# Patient Record
Sex: Male | Born: 1940 | Race: Black or African American | Hispanic: No | Marital: Married | Smoking: Never smoker
Health system: Southern US, Community
[De-identification: ages and names within clinical notes are randomized; demographics above are authoritative.]

## PROBLEM LIST (undated history)

## (undated) DIAGNOSIS — Z7901 Long term (current) use of anticoagulants: Secondary | ICD-10-CM

## (undated) DIAGNOSIS — Z862 Personal history of diseases of the blood and blood-forming organs and certain disorders involving the immune mechanism: Secondary | ICD-10-CM

## (undated) DIAGNOSIS — K869 Disease of pancreas, unspecified: Secondary | ICD-10-CM

## (undated) DIAGNOSIS — Z8709 Personal history of other diseases of the respiratory system: Secondary | ICD-10-CM

## (undated) DIAGNOSIS — G4730 Sleep apnea, unspecified: Secondary | ICD-10-CM

## (undated) DIAGNOSIS — Z87448 Personal history of other diseases of urinary system: Secondary | ICD-10-CM

## (undated) DIAGNOSIS — C61 Malignant neoplasm of prostate: Secondary | ICD-10-CM

## (undated) DIAGNOSIS — Z8639 Personal history of other endocrine, nutritional and metabolic disease: Secondary | ICD-10-CM

## (undated) DIAGNOSIS — R002 Palpitations: Secondary | ICD-10-CM

## (undated) DIAGNOSIS — I119 Hypertensive heart disease without heart failure: Secondary | ICD-10-CM

## (undated) DIAGNOSIS — Z86711 Personal history of pulmonary embolism: Secondary | ICD-10-CM

## (undated) DIAGNOSIS — Z86718 Personal history of other venous thrombosis and embolism: Secondary | ICD-10-CM

## (undated) DIAGNOSIS — I10 Essential (primary) hypertension: Secondary | ICD-10-CM

## (undated) DIAGNOSIS — Z8739 Personal history of other diseases of the musculoskeletal system and connective tissue: Secondary | ICD-10-CM

## (undated) DIAGNOSIS — M109 Gout, unspecified: Secondary | ICD-10-CM

## (undated) HISTORY — DX: Disease of pancreas, unspecified: K86.9

## (undated) HISTORY — DX: Sleep apnea, unspecified: G47.30

## (undated) HISTORY — PX: CHOLECYSTECTOMY: SHX55

## (undated) HISTORY — DX: Malignant neoplasm of prostate: C61

## (undated) HISTORY — DX: Personal history of other endocrine, nutritional and metabolic disease: Z86.39

## (undated) HISTORY — DX: Personal history of diseases of the blood and blood-forming organs and certain disorders involving the immune mechanism: Z86.2

## (undated) HISTORY — DX: Personal history of other diseases of urinary system: Z87.448

## (undated) HISTORY — DX: Long term (current) use of anticoagulants: Z79.01

## (undated) HISTORY — DX: Personal history of other diseases of the musculoskeletal system and connective tissue: Z87.39

## (undated) HISTORY — DX: Personal history of pulmonary embolism: Z86.711

## (undated) HISTORY — DX: Hypertensive heart disease without heart failure: I11.9

## (undated) HISTORY — PX: OTHER SURGICAL HISTORY: SHX169

## (undated) HISTORY — PX: APPENDECTOMY: SHX54

## (undated) HISTORY — PX: LEG SURGERY: SHX1003

## (undated) HISTORY — DX: Personal history of other venous thrombosis and embolism: Z86.718

## (undated) HISTORY — DX: Personal history of other diseases of the respiratory system: Z87.09

---

## 2008-03-15 ENCOUNTER — Inpatient Hospital Stay (HOSPITAL_COMMUNITY): Admission: RE | Admit: 2008-03-15 | Discharge: 2008-03-19 | Payer: Self-pay | Admitting: Orthopedic Surgery

## 2010-04-20 LAB — CROSSMATCH: ABO/RH(D): O POS

## 2010-04-20 LAB — BASIC METABOLIC PANEL
BUN: 8 mg/dL (ref 6–23)
Calcium: 8.4 mg/dL (ref 8.4–10.5)
Calcium: 8.5 mg/dL (ref 8.4–10.5)
Chloride: 105 mEq/L (ref 96–112)
Chloride: 96 mEq/L (ref 96–112)
Chloride: 99 mEq/L (ref 96–112)
Creatinine, Ser: 1.06 mg/dL (ref 0.4–1.5)
Creatinine, Ser: 1.47 mg/dL (ref 0.4–1.5)
GFR calc Af Amer: 52 mL/min — ABNORMAL LOW (ref >60)
GFR calc Af Amer: 58 mL/min — ABNORMAL LOW (ref >60)
GFR calc Af Amer: >60 mL/min (ref >60)
Potassium: 3.3 mEq/L — ABNORMAL LOW (ref 3.5–5.1)
Sodium: 132 mEq/L — ABNORMAL LOW (ref 135–145)

## 2010-04-20 LAB — TYPE AND SCREEN: ABO/RH(D): O POS

## 2010-04-20 LAB — CBC
HCT: 27.3 % — ABNORMAL LOW (ref 39.0–52.0)
HCT: 39.9 % (ref 39.0–52.0)
Hemoglobin: 9.2 g/dL — ABNORMAL LOW (ref 13.0–17.0)
MCHC: 34.7 g/dL (ref 30.0–36.0)
MCV: 89.3 fL (ref 78.0–100.0)
MCV: 90.3 fL (ref 78.0–100.0)
MCV: 92 fL (ref 78.0–100.0)
MCV: 92.5 fL (ref 78.0–100.0)
Platelets: 131 10*3/uL — ABNORMAL LOW (ref 150–400)
Platelets: 200 10*3/uL (ref 150–400)
RBC: 2.85 MIL/uL — ABNORMAL LOW (ref 4.22–5.81)
RBC: 3.05 MIL/uL — ABNORMAL LOW (ref 4.22–5.81)
RDW: 16.1 % — ABNORMAL HIGH (ref 11.5–15.5)
WBC: 10.9 10*3/uL — ABNORMAL HIGH (ref 4.0–10.5)
WBC: 11.6 10*3/uL — ABNORMAL HIGH (ref 4.0–10.5)
WBC: 8.5 10*3/uL (ref 4.0–10.5)

## 2010-04-20 LAB — COMPREHENSIVE METABOLIC PANEL
AST: 49 U/L — ABNORMAL HIGH (ref 0–37)
Albumin: 4.2 g/dL (ref 3.5–5.2)
BUN: 16 mg/dL (ref 6–23)
Calcium: 9.8 mg/dL (ref 8.4–10.5)
Creatinine, Ser: 1.24 mg/dL (ref 0.4–1.5)
GFR calc Af Amer: >60 mL/min (ref >60)
Total Bilirubin: 0.7 mg/dL (ref 0.3–1.2)

## 2010-04-20 LAB — PROTIME-INR
INR: 1.1 (ref 0.00–1.49)
Prothrombin Time: 14.1 seconds (ref 11.6–15.2)

## 2010-04-20 LAB — URINE CULTURE
Colony Count: NO GROWTH
Culture: NO GROWTH

## 2010-04-20 LAB — DIFFERENTIAL
Band Neutrophils: 0 % (ref 0–10)
Blasts: 0 %
Eosinophils Absolute: 0.1 10*3/uL (ref 0.0–0.7)
Eosinophils Relative: 2 % (ref 0–5)
Lymphocytes Relative: 45 % (ref 12–46)
Lymphs Abs: 2.2 10*3/uL (ref 0.7–4.0)
Metamyelocytes Relative: 0 %
Monocytes Absolute: 0.4 10*3/uL (ref 0.1–1.0)
Monocytes Relative: 9 % (ref 3–12)
nRBC: 0 /100 WBC

## 2010-04-20 LAB — URINALYSIS, ROUTINE W REFLEX MICROSCOPIC
Bilirubin Urine: NEGATIVE
Glucose, UA: NEGATIVE mg/dL
Hgb urine dipstick: NEGATIVE
Specific Gravity, Urine: 1.017 (ref 1.005–1.030)

## 2010-04-20 LAB — PREPARE RBC (CROSSMATCH)

## 2010-05-23 NOTE — Op Note (Signed)
Jose Brewer, DUMONT NO.:  [id number]   MEDICAL RECORD NO.:  [id number]          PATIENT TYPE:  INP   LOCATION:  5006                         FACILITY:  [facility]   PHYSICIAN:  Mila Homer. Sherlean Foot, M.D. DATE OF BIRTH:  06/29/1940   DATE OF PROCEDURE:  03/15/2008  DATE OF DISCHARGE:                               OPERATIVE REPORT   SURGEON:  Mila Homer. Sherlean Foot, MD   ASSISTANTS:  1. Altamese Cabal, PA-C  2. Skip Mayer, PA-C   ANESTHESIA:  General.   PREOPERATIVE DIAGNOSIS:  Right knee osteoarthritis.   POSTOPERATIVE DIAGNOSIS:  Right knee osteoarthritis.   PROCEDURE:  Right total knee arthroplasty.   INDICATIONS FOR PROCEDURE:  The patient is a 70 year old black male with  failure of conservative measures for osteoarthritis of the right knee.  Informed consent was obtained.   DESCRIPTION OF PROCEDURE:  The patient was laid supine, administered  general anesthesia.  Foley catheter was placed.  Right leg was prepped  and draped in usual sterile fashion.  The extremity was exsanguinated  with an Esmarch, and tourniquet was inflated to 350 mmHg.  Midline  incision was made with a #10 blade.  New blade was used to make a medial  parapatellar arthrotomy and performed synovectomy.  I then elevated deep  MCL off the medial crest of the tibia around to the semimembranosus  tendon and elevated the pes tendons.  This was significantly varus knee.  I then everted the patella, measured 21-mm thick.  I reamed down to 13  mm, drilled 3 lug holes through the 35-mm template and recreated the  thickness of the native patella.  I then removed the trial component and  went into flexion.  I used the extramedullary alignment system on the  tibia to make a 9-degree cut to the anatomic axis of tibia and 2-mm bone  off the medial side, which was low side.  I then used the intramedullary  system on the femur to make a 6-degree valgus cut.  I then drew out the  epicondylar axis, posterior  condylar angle measured 5 degrees.  Sized to  a size E with a sizing device, pinned to 3- to 5-degree external  rotation hole.  I placed a formal cutting block into place for size E.  Made anterior, posterior, and chamfer cuts with a sagittal saw.  I then  placed a 10-mm spacer block in the knee and obtained flexion/extension  gap balance.  I then finished the femur with a size E finishing block  and the tibia with a size 5 tibial tray drilling keel.  I then trialed  with a E femur, 5 tibia, 10 insert, and 35 patella, had good  flexion/extension gap balance, good patellar tracking.  I then copiously  irrigated with the components removed.  I then cemented in all  components, removed excess cement, allowed the cement to harden in  extension.  I placed a Hemovac coming out superolaterally and deep the  arthrotomy, pain catheter coming out superomedially and superficial  arthrotomy.  I let the tourniquet down.  The cement was hard.  Obtained  hemostasis, copiously irrigated again.  I then closed the arthrotomy  with figure-of-eight #1 Vicryl suture, deep soft tissues with buried 0  Vicryl sutures, and then subcuticular 2-0 Vicryl stitch.  I closed the  skin with skin staples.  Dressed with Xeroform dressing, sponges,  sterile Webril, and TED stocking.   COMPLICATIONS:  None.   DRAINS:  One Hemovac and one pain catheter.   ESTIMATED BLOOD LOSS:  300 mL.           ______________________________  Mila Homer. Sherlean Foot, M.D.     SDL/MEDQ  D:  03/15/2008  T:  03/15/2008  Job:  [id number]

## 2010-05-26 NOTE — Discharge Summary (Signed)
NAMEJONPAUL, LUMM NO.:  [id number]   MEDICAL RECORD NO.:  [id number]          PATIENT TYPE:  INP   LOCATION:  5006                         FACILITY:  [facility]   PHYSICIAN:  Mila Homer. Sherlean Foot, M.D. DATE OF BIRTH:  [birth year]   DATE OF ADMISSION:  03/15/2008  DATE OF DISCHARGE:  03/19/2008                               DISCHARGE SUMMARY   ADMISSION DIAGNOSES:  1. Right knee osteoarthritis.  2. Gout.  3. Hypertension.   DISCHARGE DIAGNOSES:  1. Right knee osteoarthritis.  2. Gout.  3. Hypertension.  4. Status post right total knee arthroplasty.  5. Acute blood loss anemia, status post surgery.   PROCEDURE:  Right total knee arthroplasty.   HISTORY:  The patient is a 70 year old male with a chief complaint of  right knee pain that is constant and severe.  The patient walks with a  cane, pain is interfering with activities of daily living.  Conservative  treatment had failed.  Risk and benefits of surgery were discussed with  the patient.  The patient would like to proceed with the right TKA.   ALLERGIES:  PENICILLIN.   ADMISSION MEDICATIONS:  1. Allopurinol 300 daily.  2. Metoprolol 500 daily.  3. Ibuprofen as needed.  4. Aspirin 81 mg daily.   HOSPITAL COURSE:  This is a 70 year old male, admitted March 15, 2008,  after appropriate laboratory studies were obtained preoperatively as  well as vancomycin on-call to the operating room.  He was taken to the  OR where he underwent a right TKA.  The patient tolerated the procedure  well and was taken to the PACU in good condition.  The patient was  placed on p.o. pain medication, and Foley was placed intraoperatively.   On postop day #1, vital signs were stable.  The patient denied chest  pain, shortness breath, or calf pain.  The patient started on Lovenox 30  mg subcu q.12 h. a day at 8 a.m.  Consults with PT, OT, and Care  Management were made.  The patient is weightbearing as tolerated.  The  patient was  placed in CPM 0-90 degrees for 6-8 hours per day.  Incentive  spirometry teaching was done.  On postop day #2, the patient was  struggling with physical therapy, had required moderate assist to move  from bed to chair.  Dressing was changed.  Marcaine pump was  discontinued.  Hemovac was discontinued as well.  Foley was  discontinued.  The patient was continued on p.o. pain meds.  On postop  day #3, the patient continued to require moderate assist when moving,  had some issues with physical therapy and been able to go home.  The  patient was kept for one more event.  On postop day #4, the patient did  much better with physical therapy and was able to be sent home.  He was  discharged after Lovenox teaching.   LABORATORY STUDIES:  On admission to the hospital, the patient's white  blood cell count was 4.9, H and H 13.9 and 39.9, and platelets were 200.  Sodium was 138, potassium was 36, chloride  was 104, CO2 was 25, glucose  was 90, BUN was 16, and creatinine was 124.  Upon discharge, the  patient's white blood cell count was 11.6, H and H was 9.8 and 27.3, and  platelets were 116.  Sodium was 130, potassium was 3.3, chloride was 96,  CO2 was 27, glucose was 111, BUN was 17, and creatinine was 1.60.   DISCHARGE INSTRUCTIONS:  There were no restrictions to diet.  The  patient, however, was placed on potassium pills and should limit his  fluids because the sodium was little low.  Follow the blue instruction  sheet for wound care.  Increase activity slowly.  May use a cane or  walker, weightbearing as tolerated.  No lifting or driving for 6 weeks.  Home health was established.  The patient is to be on CPM 0-90 degrees  for 6-8 hours a day for 2 weeks.  The patient is to continue wearing  thigh-high TED hose for 3 weeks.   DISCHARGE MEDICATIONS:  At discharge, the patient was given  prescriptions for:  1. Lovenox 40 mg inject 1 subcu daily, last dose March 29, 2008.  2. Robaxin 500 mg 1-2  tabs every 6 hours as needed for spasm, #60.  3. Vicodin 5/500 one to two tabs every 4-6 hours as needed for pain,      #60.  4. KCl once twice a day for a week.   The patient will follow up with Dr. Sherlean Foot on March 29, 2008, call for  appointment [phone].  The patient is discharged in improved condition.     ______________________________  Altamese Cabal, PA-C    ______________________________  Mila Homer. Sherlean Foot, M.D.    MJ/MEDQ  D:  04/30/2008  T:  04/30/2008  Job:  [id number]

## 2013-03-14 ENCOUNTER — Emergency Department (HOSPITAL_COMMUNITY)
Admission: EM | Admit: 2013-03-14 | Discharge: 2013-03-14 | Disposition: A | Discharge status: Home / Self Care | Payer: Medicare Other | Attending: Emergency Medicine | Admitting: Emergency Medicine

## 2013-03-14 ENCOUNTER — Emergency Department (HOSPITAL_COMMUNITY): Payer: Medicare Other

## 2013-03-14 ENCOUNTER — Encounter (HOSPITAL_COMMUNITY): Payer: Self-pay | Admitting: Emergency Medicine

## 2013-03-14 DIAGNOSIS — Z7901 Long term (current) use of anticoagulants: Secondary | ICD-10-CM | POA: Insufficient documentation

## 2013-03-14 DIAGNOSIS — K862 Cyst of pancreas: Secondary | ICD-10-CM

## 2013-03-14 DIAGNOSIS — R0789 Other chest pain: Secondary | ICD-10-CM

## 2013-03-14 DIAGNOSIS — IMO0002 Reserved for concepts with insufficient information to code with codable children: Secondary | ICD-10-CM | POA: Insufficient documentation

## 2013-03-14 DIAGNOSIS — W19XXXA Unspecified fall, initial encounter: Secondary | ICD-10-CM

## 2013-03-14 DIAGNOSIS — S99929A Unspecified injury of unspecified foot, initial encounter: Secondary | ICD-10-CM

## 2013-03-14 DIAGNOSIS — M25552 Pain in left hip: Secondary | ICD-10-CM

## 2013-03-14 DIAGNOSIS — S0993XA Unspecified injury of face, initial encounter: Secondary | ICD-10-CM | POA: Insufficient documentation

## 2013-03-14 DIAGNOSIS — Y9239 Other specified sports and athletic area as the place of occurrence of the external cause: Secondary | ICD-10-CM | POA: Insufficient documentation

## 2013-03-14 DIAGNOSIS — S298XXA Other specified injuries of thorax, initial encounter: Secondary | ICD-10-CM | POA: Insufficient documentation

## 2013-03-14 DIAGNOSIS — I447 Left bundle-branch block, unspecified: Secondary | ICD-10-CM | POA: Insufficient documentation

## 2013-03-14 DIAGNOSIS — M109 Gout, unspecified: Secondary | ICD-10-CM | POA: Insufficient documentation

## 2013-03-14 DIAGNOSIS — I10 Essential (primary) hypertension: Secondary | ICD-10-CM | POA: Insufficient documentation

## 2013-03-14 DIAGNOSIS — S199XXA Unspecified injury of neck, initial encounter: Secondary | ICD-10-CM

## 2013-03-14 DIAGNOSIS — W010XXA Fall on same level from slipping, tripping and stumbling without subsequent striking against object, initial encounter: Secondary | ICD-10-CM | POA: Insufficient documentation

## 2013-03-14 DIAGNOSIS — R109 Unspecified abdominal pain: Secondary | ICD-10-CM

## 2013-03-14 DIAGNOSIS — Z880 Allergy status to penicillin: Secondary | ICD-10-CM | POA: Insufficient documentation

## 2013-03-14 DIAGNOSIS — Z7982 Long term (current) use of aspirin: Secondary | ICD-10-CM | POA: Insufficient documentation

## 2013-03-14 DIAGNOSIS — M79671 Pain in right foot: Secondary | ICD-10-CM

## 2013-03-14 DIAGNOSIS — Y9301 Activity, walking, marching and hiking: Secondary | ICD-10-CM | POA: Insufficient documentation

## 2013-03-14 DIAGNOSIS — S8990XA Unspecified injury of unspecified lower leg, initial encounter: Secondary | ICD-10-CM | POA: Insufficient documentation

## 2013-03-14 DIAGNOSIS — Y92838 Other recreation area as the place of occurrence of the external cause: Secondary | ICD-10-CM

## 2013-03-14 DIAGNOSIS — R000 Tachycardia, unspecified: Secondary | ICD-10-CM | POA: Insufficient documentation

## 2013-03-14 DIAGNOSIS — S99919A Unspecified injury of unspecified ankle, initial encounter: Secondary | ICD-10-CM

## 2013-03-14 DIAGNOSIS — Z79899 Other long term (current) drug therapy: Secondary | ICD-10-CM | POA: Insufficient documentation

## 2013-03-14 DIAGNOSIS — S0990XA Unspecified injury of head, initial encounter: Secondary | ICD-10-CM | POA: Insufficient documentation

## 2013-03-14 HISTORY — DX: Gout, unspecified: M10.9

## 2013-03-14 HISTORY — DX: Palpitations: R00.2

## 2013-03-14 HISTORY — DX: Essential (primary) hypertension: I10

## 2013-03-14 LAB — CBC WITH DIFFERENTIAL/PLATELET
BASOS ABS: 0 10*3/uL (ref 0.0–0.1)
BASOS PCT: 0 % (ref 0–1)
Eosinophils Absolute: 0 10*3/uL (ref 0.0–0.7)
Eosinophils Relative: 0 % (ref 0–5)
HEMATOCRIT: 37.4 % — AB (ref 39.0–52.0)
HEMOGLOBIN: 13.4 g/dL (ref 13.0–17.0)
LYMPHS PCT: 23 % (ref 12–46)
Lymphs Abs: 1 10*3/uL (ref 0.7–4.0)
MCH: 32.1 pg (ref 26.0–34.0)
MCHC: 35.8 g/dL (ref 30.0–36.0)
MCV: 89.5 fL (ref 78.0–100.0)
MONO ABS: 0.5 10*3/uL (ref 0.1–1.0)
MONOS PCT: 11 % (ref 3–12)
NEUTROS ABS: 2.8 10*3/uL (ref 1.7–7.7)
NEUTROS PCT: 66 % (ref 43–77)
Platelets: 235 10*3/uL (ref 150–400)
RBC: 4.18 MIL/uL — ABNORMAL LOW (ref 4.22–5.81)
RDW: 14.6 % (ref 11.5–15.5)
WBC: 4.3 10*3/uL (ref 4.0–10.5)

## 2013-03-14 LAB — BASIC METABOLIC PANEL
BUN: 18 mg/dL (ref 6–23)
CHLORIDE: 96 meq/L (ref 96–112)
CO2: 22 mEq/L (ref 19–32)
Calcium: 9.6 mg/dL (ref 8.4–10.5)
Creatinine, Ser: 1.13 mg/dL (ref 0.50–1.35)
GFR, EST AFRICAN AMERICAN: 73 mL/min — AB (ref >90)
GFR, EST NON AFRICAN AMERICAN: 63 mL/min — AB (ref >90)
Glucose, Bld: 83 mg/dL (ref 70–99)
POTASSIUM: 4 meq/L (ref 3.7–5.3)
SODIUM: 137 meq/L (ref 137–147)

## 2013-03-14 LAB — I-STAT TROPONIN, ED: TROPONIN I, POC: 0.06 ng/mL (ref 0.00–0.08)

## 2013-03-14 LAB — TROPONIN I: Troponin I: <0.3 ng/mL (ref <0.30)

## 2013-03-14 MED ORDER — HYDROCODONE-ACETAMINOPHEN 5-325 MG PO TABS: 1.0000 | ORAL_TABLET | ORAL | Status: DC | PRN

## 2013-03-14 MED ORDER — DIPHENHYDRAMINE HCL 25 MG PO CAPS
25.0000 mg | ORAL_CAPSULE | Freq: Once | ORAL | Status: AC
  Administered 2013-03-14: 25 mg via ORAL
  Filled 2013-03-14: qty 1

## 2013-03-14 MED ORDER — FENTANYL CITRATE 0.05 MG/ML IJ SOLN
50.0000 ug | Freq: Once | INTRAMUSCULAR | Status: AC
  Administered 2013-03-14: 50 ug via INTRAVENOUS
  Filled 2013-03-14: qty 2

## 2013-03-14 MED ORDER — IOHEXOL 300 MG/ML  SOLN
100.0000 mL | Freq: Once | INTRAMUSCULAR | Status: AC | PRN
  Administered 2013-03-14: 100 mL via INTRAVENOUS

## 2013-03-14 NOTE — ED Provider Notes (Signed)
CSN: [id number]     Arrival date & time 03/14/13  0915 History   First MD Initiated Contact with Patient 03/14/13 0930     Chief Complaint  Patient presents with  . Fall  . 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  Pt was walking outside and slipped on wet/ ice ground, mild left head injury but primary impact left flank.  No new sxs prior to fall.  Pt developed left chest wall pain afterward which is different than the intermittent cp/ sob he has had since his PE.  Pain with movement and palpation/ breathing. No loc.  Patient is a 73 y.o. male presenting with fall and chest pain. The history is provided by the patient and a relative.  Fall Associated symptoms include chest pain (wall). Pertinent negatives include no abdominal pain, no headaches and no shortness of breath.  Chest Pain Associated symptoms: back pain   Associated symptoms: no abdominal pain, no cough, no fever, no headache, no shortness of breath and not vomiting     Past Medical History  Diagnosis Date  . Hypertension   . Gout   . Palpitations    History reviewed. No pertinent past surgical history. No family history on file. History  Substance Use Topics  . Smoking status: Not on file  . Smokeless tobacco: Not on file  . Alcohol Use: Yes    Review of Systems  Constitutional: Negative for fever and chills.  HENT: Negative for congestion.   Eyes: Negative for visual disturbance.  Respiratory: Negative for cough and shortness of breath.   Cardiovascular: Positive for chest pain (wall). Negative for leg swelling.  Gastrointestinal: Negative for vomiting and abdominal pain.  Genitourinary: Negative for dysuria and flank pain.  Musculoskeletal: Positive for arthralgias, back pain and neck pain. Negative for neck stiffness.  Skin:  Negative for rash.  Neurological: Negative for light-headedness and headaches.      Allergies  Penicillins  Home Medications   Current Outpatient Rx  Name  Route  Sig  Dispense  Refill  . allopurinol (ZYLOPRIM) 300 MG tablet   Oral   Take 300 mg by mouth daily.         Marland Kitchen amLODipine (NORVASC) 10 MG tablet   Oral   Take 10 mg by mouth daily.         Marland Kitchen aspirin 81 MG tablet   Oral   Take 81 mg by mouth daily.         Marland Kitchen PRESCRIPTION MEDICATION   Oral   Take 1 tablet by mouth daily as needed (Stomach upset).         . warfarin (COUMADIN) 6 MG tablet   Oral   Take 6 mg by mouth daily at 6 PM.           BP 185/61  Pulse 104  Temp(Src) 98.1 F (36.7 C)  Resp 18  Ht 5\' 6"  (1.676 m)  Wt 210 lb (95.255 kg)  BMI 33.91 kg/m2  SpO2 97% Physical Exam  Nursing note and vitals reviewed. Constitutional: He is oriented to person, place, and time. He appears well-developed and well-nourished. No distress.  HENT:  Head: Normocephalic and atraumatic.  Mild dry mm  Eyes: Conjunctivae are normal. Right eye exhibits no discharge. Left eye exhibits no discharge.  Neck: Normal range of motion. Neck supple. No tracheal deviation present.  Cardiovascular: Regular rhythm.  Tachycardia present.   Pulmonary/Chest: Effort normal and breath sounds normal.  Abdominal: Soft. He exhibits no distension. There is tenderness (left upper abdomen). There is no guarding.  Musculoskeletal: He exhibits tenderness. He exhibits no edema.  Mild tender right dorsal foot, left anterior knee, left lateral hip with ext rotation, left lateral and anterior ribs without step off, C6-7 midline/ paraspinal tender  Full rom of hips, shoulders, knees and ankles, no significant effusion.  Mild lumbar tenderness midline  Neurological: He is alert and oriented to person, place, and time. No cranial nerve deficit. GCS eye subscore is 4. GCS verbal subscore is 5. GCS motor subscore is 6.  Moves ext equal  bilateral with pain Sensation intact UE and LE Neck supple Perrl, eomfi  Skin: Skin is warm. No rash noted.  Psychiatric: He has a normal mood and affect.    ED Course  Procedures (including critical care time) Ultrasound limited abdominal and limited transthoracic ultrasound (FAST)  Indication: left flank pain, fall Four views were obtained using the low frequency transducer: Splenorenal, Hepatorenal, Retrovesical, Pericardial subxyphoid Interpretation: No free fluid visualized surrounding the kidneys, pelvis or pericardium. Images archived electronically Dr. Reather Converse personally performed and interpreted the images  Labs Review Labs Reviewed  BASIC METABOLIC PANEL - Abnormal; Notable for the following:    GFR calc non Af Amer 63 (*)    GFR calc Af Amer 73 (*)    All other components within normal limits  CBC WITH DIFFERENTIAL - Abnormal; Notable for the following:    RBC 4.18 (*)    HCT 37.4 (*)    All other components within normal limits  TROPONIN I   Imaging Review Dg Hip Complete Left  03/14/2013   CLINICAL DATA:  History of fall complaining of left hip, knee and foot pain.  EXAM: LEFT HIP - COMPLETE 2+ VIEW  COMPARISON:  No priors.  FINDINGS: Three views of the bony pelvis and the left hip demonstrate no acute displaced fracture of the bony pelvic ring. Left proximal femur as visualized is intact, and the left femoral head is properly located. Moderate degenerative changes of osteoarthritis are noted in the hip joints bilaterally. Numerous pelvic phleboliths are incidentally noted.  IMPRESSION: 1. No acute radiographic abnormality of the bony pelvis or the left hip. 2. Moderate bilateral hip joint osteoarthritis.   Electronically Signed   By: Vinnie Langton M.D.   On: 03/14/2013 11:27   Dg Knee 2 Views Left  03/14/2013   CLINICAL DATA:  History of fall complaining of left hip and knee pain.  EXAM: LEFT KNEE - 1-2 VIEW  COMPARISON:  No priors.  FINDINGS: Two views of the left  knee demonstrate no definite acute displaced fracture, subluxation or dislocation. There is joint space narrowing, subchondral sclerosis, subchondral cyst formation and osteophyte formation in a tricompartmental distribution, most severe in the medial and patellofemoral compartments, compatible with osteoarthritis.  IMPRESSION: 1. No acute radiographic abnormality of the left knee. 2. Mild to moderate osteoarthritis in the left knee.   Electronically Signed   By: Vinnie Langton M.D.   On: 03/14/2013 11:31   Ct Head Wo Contrast  03/14/2013   CLINICAL DATA:  History of trauma from a fall.  EXAM: CT HEAD WITHOUT CONTRAST  CT CERVICAL SPINE WITHOUT CONTRAST  TECHNIQUE: Multidetector CT imaging of the head and cervical spine was performed following the standard protocol without intravenous contrast. Multiplanar CT image reconstructions of the cervical spine were also generated.  COMPARISON:  Head CT 03/01/2013.  FINDINGS: CT HEAD FINDINGS  Mild cerebral and moderate cerebellar atrophy. Patchy and confluent areas of decreased attenuation are noted throughout the deep and periventricular white matter of the cerebral hemispheres bilaterally, compatible with chronic microvascular ischemic disease. No acute displaced skull fractures are identified. No acute intracranial abnormality. Specifically, no evidence of acute post-traumatic intracranial hemorrhage, no definite regions of acute/subacute cerebral ischemia, no focal mass, mass effect, hydrocephalus or abnormal intra or extra-axial fluid collections. The visualized paranasal sinuses and mastoids are well pneumatized.  CT CERVICAL SPINE FINDINGS  No acute displaced fracture of the cervical spine. There is some reversal of normal cervical lordosis centered at the level of C5, presumably positional. Alignment is otherwise anatomic. Prevertebral soft tissues are normal. Retropharyngeal left common carotid artery (normal anatomical variant) incidentally noted. Severe  multilevel degenerative disc disease, most pronounced at C5-C6 and C6-C7. Severe multilevel facet arthropathy. Visualized portions of the upper thorax demonstrate some mild expansion of extrapleural fat in the right apex posteriorly (a benign finding), but are otherwise unremarkable.  IMPRESSION: 1. No evidence of significant acute traumatic injury to the skull, brain or cervical spine. 2. Mild cerebral and moderate cerebellar atrophy with extensive chronic microvascular ischemic changes in the cerebral white matter. 3. Severe multilevel degenerative disc disease and cervical spondylosis, as above.   Electronically Signed   By: Vinnie Langton M.D.   On: 03/14/2013 11:52   Ct Chest W Contrast  03/14/2013   CLINICAL DATA:  History of trauma from a fall.  Chest pain.  EXAM: CT CHEST, ABDOMEN, AND PELVIS WITH CONTRAST  TECHNIQUE: Multidetector CT imaging of the chest, abdomen and pelvis was performed following the standard protocol during bolus administration of intravenous contrast.  CONTRAST:  122mL OMNIPAQUE IOHEXOL 300 MG/ML  SOLN  COMPARISON:  Multiple priors, most recently CT of the chest, abdomen and pelvis 12/25/2011. Marland Kitchen  FINDINGS: CT CHEST FINDINGS  Mediastinum: Heart size is mildly enlarged. There is no significant pericardial fluid, thickening or pericardial calcification. No abnormal high attenuation fluid within the mediastinum to suggest posttraumatic mediastinal hematoma. No evidence of posttraumatic aortic dissection/transection. No pathologically enlarged mediastinal or hilar lymph nodes. A small hiatal hernia. There is atherosclerosis of the thoracic aorta, the great vessels of the mediastinum and the coronary arteries, including calcified atherosclerotic plaque in the left circumflex and right coronary arteries. Calcifications of the aortic valve.  Lungs/Pleura: No acute consolidative airspace disease to suggest significant posttraumatic contusion or sequela of aspiration. No pleural effusions. No  pneumothorax. No suspicious appearing pulmonary nodules or masses. Small focus of expansion of extrapleural fat in the posterior aspect of the right apex (a benign finding) incidentally noted.  Musculoskeletal: No acute displaced fractures or aggressive appearing lytic or blastic lesions are noted in the visualized portions of the skeleton.  CT ABDOMEN AND PELVIS FINDINGS  Abdomen/Pelvis: No abnormal high attenuation fluid collection within the peritoneal cavity or retroperitoneum to suggest posttraumatic hemorrhage. No evidence of acute posttraumatic dissection/transsection of the abdominal aorta or major abdominal and pelvic arterial branches. Status post cholecystectomy. Diffuse low attenuation throughout the hepatic parenchyma, compatible with severe hepatic steatosis. In the mid body of the pancreas there are 2 tiny low-attenuation lesions, largest of which measures 13 x 11 mm (image 53 of series 3), which are new compared to the prior study. The appearance of the spleen, bilateral adrenal glands and bilateral kidneys is unremarkable. No significant volume of ascites. No pneumoperitoneum. No pathologic distention of small bowel. Mild atherosclerosis  throughout the abdominal and pelvic vasculature. No definite lymphadenopathy identified in the abdomen or pelvis. Normal appendix.  Musculoskeletal: No acute displaced fractures or aggressive appearing lytic or blastic lesions are noted in the visualized portions of the skeleton.  IMPRESSION: 1. No signs of significant acute traumatic injury to the chest, abdomen or pelvis. 2. No acute findings. 3. Atherosclerosis, including 2 vessel coronary artery disease. Assessment for potential risk factor modification, dietary therapy or pharmacologic therapy may be warranted, if clinically indicated. 4. Severe hepatic steatosis. 5. Small hiatal hernia. 6. Normal appendix.   Electronically Signed   By: Vinnie Langton M.D.   On: 03/14/2013 12:04   Ct Cervical Spine Wo  Contrast  03/14/2013   CLINICAL DATA:  History of trauma from a fall.  EXAM: CT HEAD WITHOUT CONTRAST  CT CERVICAL SPINE WITHOUT CONTRAST  TECHNIQUE: Multidetector CT imaging of the head and cervical spine was performed following the standard protocol without intravenous contrast. Multiplanar CT image reconstructions of the cervical spine were also generated.  COMPARISON:  Head CT 03/01/2013.  FINDINGS: CT HEAD FINDINGS  Mild cerebral and moderate cerebellar atrophy. Patchy and confluent areas of decreased attenuation are noted throughout the deep and periventricular white matter of the cerebral hemispheres bilaterally, compatible with chronic microvascular ischemic disease. No acute displaced skull fractures are identified. No acute intracranial abnormality. Specifically, no evidence of acute post-traumatic intracranial hemorrhage, no definite regions of acute/subacute cerebral ischemia, no focal mass, mass effect, hydrocephalus or abnormal intra or extra-axial fluid collections. The visualized paranasal sinuses and mastoids are well pneumatized.  CT CERVICAL SPINE FINDINGS  No acute displaced fracture of the cervical spine. There is some reversal of normal cervical lordosis centered at the level of C5, presumably positional. Alignment is otherwise anatomic. Prevertebral soft tissues are normal. Retropharyngeal left common carotid artery (normal anatomical variant) incidentally noted. Severe multilevel degenerative disc disease, most pronounced at C5-C6 and C6-C7. Severe multilevel facet arthropathy. Visualized portions of the upper thorax demonstrate some mild expansion of extrapleural fat in the right apex posteriorly (a benign finding), but are otherwise unremarkable.  IMPRESSION: 1. No evidence of significant acute traumatic injury to the skull, brain or cervical spine. 2. Mild cerebral and moderate cerebellar atrophy with extensive chronic microvascular ischemic changes in the cerebral white matter. 3. Severe  multilevel degenerative disc disease and cervical spondylosis, as above.   Electronically Signed   By: Vinnie Langton M.D.   On: 03/14/2013 11:52   Ct Abdomen Pelvis W Contrast  03/14/2013   CLINICAL DATA:  History of trauma from a fall.  Chest pain.  EXAM: CT CHEST, ABDOMEN, AND PELVIS WITH CONTRAST  TECHNIQUE: Multidetector CT imaging of the chest, abdomen and pelvis was performed following the standard protocol during bolus administration of intravenous contrast.  CONTRAST:  150mL OMNIPAQUE IOHEXOL 300 MG/ML  SOLN  COMPARISON:  Multiple priors, most recently CT of the chest, abdomen and pelvis 12/25/2011. Marland Kitchen  FINDINGS: CT CHEST FINDINGS  Mediastinum: Heart size is mildly enlarged. There is no significant pericardial fluid, thickening or pericardial calcification. No abnormal high attenuation fluid within the mediastinum to suggest posttraumatic mediastinal hematoma. No evidence of posttraumatic aortic dissection/transection. No pathologically enlarged mediastinal or hilar lymph nodes. A small hiatal hernia. There is atherosclerosis of the thoracic aorta, the great vessels of the mediastinum and the coronary arteries, including calcified atherosclerotic plaque in the left circumflex and right coronary arteries. Calcifications of the aortic valve.  Lungs/Pleura: No acute consolidative airspace disease to suggest significant posttraumatic contusion  or sequela of aspiration. No pleural effusions. No pneumothorax. No suspicious appearing pulmonary nodules or masses. Small focus of expansion of extrapleural fat in the posterior aspect of the right apex (a benign finding) incidentally noted.  Musculoskeletal: No acute displaced fractures or aggressive appearing lytic or blastic lesions are noted in the visualized portions of the skeleton.  CT ABDOMEN AND PELVIS FINDINGS  Abdomen/Pelvis: No abnormal high attenuation fluid collection within the peritoneal cavity or retroperitoneum to suggest posttraumatic hemorrhage.  No evidence of acute posttraumatic dissection/transsection of the abdominal aorta or major abdominal and pelvic arterial branches. Status post cholecystectomy. Diffuse low attenuation throughout the hepatic parenchyma, compatible with severe hepatic steatosis. In the mid body of the pancreas there are 2 tiny low-attenuation lesions, largest of which measures 13 x 11 mm (image 53 of series 3), which are new compared to the prior study. The appearance of the spleen, bilateral adrenal glands and bilateral kidneys is unremarkable. No significant volume of ascites. No pneumoperitoneum. No pathologic distention of small bowel. Mild atherosclerosis throughout the abdominal and pelvic vasculature. No definite lymphadenopathy identified in the abdomen or pelvis. Normal appendix.  Musculoskeletal: No acute displaced fractures or aggressive appearing lytic or blastic lesions are noted in the visualized portions of the skeleton.  IMPRESSION: 1. No signs of significant acute traumatic injury to the chest, abdomen or pelvis. 2. No acute findings. 3. Atherosclerosis, including 2 vessel coronary artery disease. Assessment for potential risk factor modification, dietary therapy or pharmacologic therapy may be warranted, if clinically indicated. 4. Severe hepatic steatosis. 5. Small hiatal hernia. 6. Normal appendix.   Electronically Signed   By: Vinnie Langton M.D.   On: 03/14/2013 12:04   Dg Foot Complete Left  03/14/2013   CLINICAL DATA:  History of fall complaining of left foot pain.  EXAM: LEFT FOOT - COMPLETE 3+ VIEW  COMPARISON:  No priors.  FINDINGS: Three views of the left foot demonstrate no acute displaced fracture, subluxation or dislocation. Small dorsal calcaneal spur incidentally noted. Mild multifocal degenerative changes of osteoarthritis.  IMPRESSION: 1. No acute radiographic abnormality of the left foot.   Electronically Signed   By: Vinnie Langton M.D.   On: 03/14/2013 11:32     EKG  Interpretation   Date/Time:  Saturday March 14 2013 09:25:14 EST Ventricular Rate:  106 PR Interval:  176 QRS Duration: 168 QT Interval:  422 QTC Calculation: 560 R Axis:   -80 Text Interpretation:  Sinus tachycardia with occasional Premature  ventricular complexes Left axis deviation Left bundle branch block  Abnormal ECG Poor baseline Confirmed by Inaya Gillham  MD, Eoin Willden ([id number]) on  03/14/2013 9:36:20 AM      MDM   Final diagnoses:  Fall  LBBB (left bundle branch block)  Left flank pain  Left hip pain  Right foot pain  Chest wall pain  Pancreatic cyst   Mechanical fall.  Chest pain clarified in detail with family/ patient.  This chest pain is actually left upper abdo and left lower flank, 100% reproduceable, started after fall, different that pt hx of cp.  Will screen troponin however focus in ED on trauma as pain and fup with pcp/ cardiology with new LBBB on ekg.  Pain medicines given. CTs pending.  Xrays ordered. Bedside US no free fluid on FAST.   Pain improved on ED. Imaging no acute findings. Fup outpt and pcp for LBBB and MSK pain. Pt improved on recheck, discussed fup outpt.   Fup with cardiology for new LBBB.  Results and differential diagnosis were discussed with the patient. Close follow up outpatient was discussed, patient comfortable with the plan.            Mariea Clonts, MD 03/15/13 2156

## 2013-03-14 NOTE — Discharge Instructions (Signed)
If you were given medicines take as directed.  If you are on coumadin or contraceptives realize their levels and effectiveness is altered by many different medicines.  If you have any reaction (rash, tongues swelling, other) to the medicines stop taking and see a physician.   Please follow up as directed and return to the ER or see a physician for new or worsening symptoms.  Thank you. For severe pain take norco or vicodin however realize they have the potential for addiction and it can make you sleepy and has tylenol in it.  No operating machinery while taking. Have MRI of your abdomen to check pancreas cyst within one year by your doctor.

## 2013-03-14 NOTE — ED Notes (Signed)
Returned to room.

## 2013-03-14 NOTE — ED Notes (Signed)
Pt. Stated, i slipped outside and fell on my left side, and right after that I started having chest pain.

## 2013-03-14 NOTE — ED Notes (Signed)
While ambulating, pts heart rate went up to 126. After sitting down, hr went back down to 86.

## 2013-03-14 NOTE — ED Notes (Signed)
Ppt. Needed to go to Bathroom before we did the EKG, stated, I can't wait"

## 2013-03-14 NOTE — ED Notes (Signed)
Patient transported to CT and X ray 

## 2013-08-13 HISTORY — PX: PROSTATE BIOPSY: SHX241

## 2013-09-02 ENCOUNTER — Encounter: Payer: Self-pay | Admitting: Radiation Oncology

## 2013-09-02 DIAGNOSIS — C61 Malignant neoplasm of prostate: Secondary | ICD-10-CM | POA: Insufficient documentation

## 2013-09-02 NOTE — Progress Notes (Signed)
GU Location of Tumor / Histology: prostatic adenocarcinoma  If Prostate Cancer, Gleason Score is (4 + 3) and PSA is (8.2)  Rolena Infante referred by Dr. Dustin Flock to Dr. Louis Meckel on 07/16/2013 for evaluation of rising PSA  Biopsies of prostate (if applicable) revealed:   Past/Anticipated interventions by urology, if any: external beam encouraged  Past/Anticipated interventions by medical oncology, if any: no  Weight changes, if any: no  Bowel/Bladder complaints, if any: denies changes in his voiding symptoms   Nausea/Vomiting, if any: no  Pain issues, if any:  no  SAFETY ISSUES:  Prior radiation? no  Pacemaker/ICD? no  Possible current pregnancy? no  Is the patient on methotrexate? no  Current Complaints / other details:  73 year old male. Married.

## 2013-09-03 ENCOUNTER — Encounter: Payer: Self-pay | Admitting: Radiation Oncology

## 2013-09-03 ENCOUNTER — Ambulatory Visit
Admission: RE | Admit: 2013-09-03 | Discharge: 2013-09-03 | Disposition: A | Discharge status: Home / Self Care | Payer: Medicare Other | Source: Ambulatory Visit | Attending: Radiation Oncology | Admitting: Radiation Oncology

## 2013-09-03 VITALS — BP 150/75 | HR 52 | Temp 97.8°F | Resp 20 | Ht 66.0 in | Wt 216.0 lb

## 2013-09-03 DIAGNOSIS — M109 Gout, unspecified: Secondary | ICD-10-CM | POA: Diagnosis not present

## 2013-09-03 DIAGNOSIS — I119 Hypertensive heart disease without heart failure: Secondary | ICD-10-CM | POA: Diagnosis not present

## 2013-09-03 DIAGNOSIS — G4733 Obstructive sleep apnea (adult) (pediatric): Secondary | ICD-10-CM | POA: Insufficient documentation

## 2013-09-03 DIAGNOSIS — Z86718 Personal history of other venous thrombosis and embolism: Secondary | ICD-10-CM | POA: Diagnosis not present

## 2013-09-03 DIAGNOSIS — I509 Heart failure, unspecified: Secondary | ICD-10-CM

## 2013-09-03 DIAGNOSIS — Z9889 Other specified postprocedural states: Secondary | ICD-10-CM

## 2013-09-03 DIAGNOSIS — I2699 Other pulmonary embolism without acute cor pulmonale: Secondary | ICD-10-CM | POA: Insufficient documentation

## 2013-09-03 DIAGNOSIS — Z510 Encounter for antineoplastic radiation therapy: Secondary | ICD-10-CM | POA: Insufficient documentation

## 2013-09-03 DIAGNOSIS — K869 Disease of pancreas, unspecified: Secondary | ICD-10-CM | POA: Insufficient documentation

## 2013-09-03 DIAGNOSIS — Z86711 Personal history of pulmonary embolism: Secondary | ICD-10-CM | POA: Insufficient documentation

## 2013-09-03 DIAGNOSIS — C61 Malignant neoplasm of prostate: Secondary | ICD-10-CM | POA: Diagnosis not present

## 2013-09-03 DIAGNOSIS — Z7901 Long term (current) use of anticoagulants: Secondary | ICD-10-CM | POA: Diagnosis not present

## 2013-09-03 DIAGNOSIS — Z7982 Long term (current) use of aspirin: Secondary | ICD-10-CM | POA: Insufficient documentation

## 2013-09-03 DIAGNOSIS — J449 Chronic obstructive pulmonary disease, unspecified: Secondary | ICD-10-CM

## 2013-09-03 DIAGNOSIS — I110 Hypertensive heart disease with heart failure: Secondary | ICD-10-CM | POA: Insufficient documentation

## 2013-09-03 DIAGNOSIS — Z9049 Acquired absence of other specified parts of digestive tract: Secondary | ICD-10-CM

## 2013-09-03 DIAGNOSIS — I10 Essential (primary) hypertension: Secondary | ICD-10-CM

## 2013-09-03 DIAGNOSIS — I82409 Acute embolism and thrombosis of unspecified deep veins of unspecified lower extremity: Secondary | ICD-10-CM

## 2013-09-03 NOTE — Progress Notes (Signed)
Radiation Oncology         [phone] ________________________________  Initial outpatient Consultation  Name: Jose Brewer MRN: [id number]  Date: 09/03/2013  DOB: [birth year]  CZ:YSAY, PHILLIP, PA-C  Ardis Hughs, MD   REFERRING PHYSICIAN: Ardis Hughs, MD  DIAGNOSIS: 73 y.o. gentleman with stage T1c adenocarcinoma of the prostate with a Gleason's score of 4+3 and a PSA of 9.78  HISTORY OF PRESENT ILLNESS::Jose Brewer is a 73 y.o. gentleman.  He was noted to have an elevated PSA of 8.2 on 06/04/13 by his primary care provider, [facility].  Accordingly, he was referred for evaluation in urology by Dr. Louis Meckel on 07/16/13,  digital rectal examination was performed at that time revealing a 1+ gland with no nodules.  Repeat PSA at the time of that visit remained elevated at 9.78.  The patient proceeded to transrectal ultrasound with 12 biopsies of the prostate on 08/13/13.  The prostate volume measured 34.57 cc.  Out of 12 core biopsies, 8 were positive.  The maximum Gleason score was 4+3, and this was seen in the distribution shown in the figure displayed below.   The patient reviewed the biopsy results with his urologist and he has kindly been referred today for discussion of potential radiation treatment options.  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    PAST SURGICAL HISTORY: Past Surgical History  Procedure  Laterality Date  . Prostate biopsy  08/13/13    gleason 4+3=7, vol 35  . Appendectomy    . Right knee surgery      FAMILY HISTORY: family history includes Cancer in his father; Stroke in his mother.  SOCIAL HISTORY:  reports that he has never smoked. He has never used smokeless tobacco. He reports that he drinks alcohol. He reports that he does not use illicit drugs.  ALLERGIES: Codeine; Hydrocodone; Oxycodone hcl; and Penicillins  MEDICATIONS:  Current Outpatient Prescriptions  Medication Sig Dispense Refill  . allopurinol (ZYLOPRIM) 300 MG tablet Take 300 mg by mouth daily.      Marland Kitchen amLODipine (NORVASC) 10 MG tablet Take 10 mg by mouth daily.      Marland Kitchen aspirin 81 MG tablet Take 81 mg by mouth daily.      . colchicine 0.6 MG tablet Take 0.6 mg by mouth daily.      Marland Kitchen HYDROcodone-acetaminophen (NORCO) 5-325 MG per tablet Take 1 tablet by mouth every 4 (four) hours as needed.  6 tablet  0  . levofloxacin (LEVAQUIN) 500 MG tablet Take 500 mg by mouth daily.      Marland Kitchen METOPROLOL TARTRATE PO Take 25 mg by mouth.      Marland Kitchen PRESCRIPTION MEDICATION Take 1 tablet by mouth daily as needed (Stomach upset).      . warfarin (COUMADIN) 6 MG tablet Take 6 mg by mouth daily at 6 PM.       . zolpidem (AMBIEN) 5 MG tablet Take 5 mg by mouth at bedtime as needed for sleep.       No current facility-administered medications for this encounter.  REVIEW OF SYSTEMS:  A 15 point review of systems is documented in the electronic medical record. This was obtained by the nursing staff. However, I reviewed this with the patient to discuss relevant findings and make appropriate changes.  A comprehensive review of systems was negative..  The patient completed an IPSS and IIEF questionnaire.  His IPSS score was 6 indicating mild urinary outflow obstructive symptoms.  He indicated that his erectile function is able to complete sexual activity on some attempts, but, this is not a high priority.   PHYSICAL EXAM: This patient is  in no acute distress.  He is alert and oriented.   height is 5\' 6"  (1.676 m) and weight is 216 lb (97.977 kg).  He exhibits no respiratory distress or labored breathing.  He appears neurologically intact.  His mood is pleasant.  His affect is appropriate.  Please note the digital rectal exam findings described above.  KPS = 100  100 - Normal; no complaints; no evidence of disease. 90   - Able to carry on normal activity; minor signs or symptoms of disease. 80   - Normal activity with effort; some signs or symptoms of disease. 56   - Cares for self; unable to carry on normal activity or to do active work. 60   - Requires occasional assistance, but is able to care for most of his personal needs. 50   - Requires considerable assistance and frequent medical care. 4   - Disabled; requires special care and assistance. 49   - Severely disabled; hospital admission is indicated although death not imminent. 89   - Very sick; hospital admission necessary; active supportive treatment necessary. 10   - Moribund; fatal processes progressing rapidly. 0     - Dead  Karnofsky DA, Abelmann Daniel, Craver LS and Burchenal [facility] [phone]) The use of the nitrogen mustards in the palliative treatment of carcinoma: with particular reference to bronchogenic carcinoma Cancer 1 634-56   LABORATORY DATA:  Lab Results  Component Value Date   WBC 4.3 03/14/2013   HGB 13.4 03/14/2013   HCT 37.4* 03/14/2013   MCV 89.5 03/14/2013   PLT 235 03/14/2013   Lab Results  Component Value Date   NA 137 03/14/2013   K 4.0 03/14/2013   CL 96 03/14/2013   CO2 22 03/14/2013   Lab Results  Component Value Date   ALT 58* 03/10/2008   AST 49* 03/10/2008   ALKPHOS 56 03/10/2008   BILITOT 0.7 03/10/2008     RADIOGRAPHY: No results found.    IMPRESSION: This gentleman is a 73 y.o. gentleman with stage T1c adenocarcinoma of the prostate with a Gleason's score of 4+3 and a PSA of 9.78.  His T-Stage, Gleason's Score, and PSA put him into the intermediate  risk group.  Accordingly he is eligible for a variety of potential treatment options including external radiation with concomitant androgen deprivation.  PLAN:Today I reviewed the findings and workup thus far.  We discussed the natural history of prostate cancer.  We reviewed the the implications of T-stage, Gleason's Score, and PSA on decision-making and outcomes in prostate cancer.  We discussed radiation treatment in the management of prostate cancer with regard to the logistics and delivery of external beam radiation treatment as well as the logistics and delivery of prostate brachytherapy.  We compared and contrasted each of these approaches and also compared these against prostatectomy.  The patient expressed interest in external beam radiotherapy.  I filled out a patient counseling form  for him with relevant treatment diagrams and we retained a copy for our records.   The patient may benefit from androgen deprivation initially for 2 months prior to radiotherapy and continuation for 6-8 months total.  The patient would like to proceed with prostate IMRT.  Since he lives in [place], he would like to pursue IMRT closer to his home at the [facility] with Dr. Orlene Erm.  I will coordinate referral to Dr. Orlene Erm.  I will share my findings with Dr. Louis Meckel and move forward with scheduling placement of three gold fiducial markers and initiation of androgen deprivation to proceed with IMRT in the future.     I enjoyed meeting with him today, and will look forward to participating in the care of this very nice gentleman.   I spent 60 minutes face to face with the patient and more than 50% of that time was spent in counseling and/or coordination of care.   ------------------------------------------------  Sheral Apley. Tammi Klippel, M.D.

## 2013-09-03 NOTE — Progress Notes (Signed)
GU Location of Tumor / Histology: prostatic adenocarcinoma   If Prostate Cancer, Gleason Score is (4 + 3) and PSA is (8.2)   Jose Brewer referred by Dr. Dustin Flock to Dr. [name] on 07/16/2013 for evaluation of rising PSA   Biopsies of prostate (if applicable) revealed: 08/10/62   Volume 35   Past/Anticipated interventions by urology, if any: external beam encouraged   Past/Anticipated interventions by medical oncology, if any: no   Weight changes, if any: no  Bowel/Bladder complaints, if any: IPSS 6, nocturia x 3, frequency Nausea/Vomiting, if any: no  Pain issues, if any: no   SAFETY ISSUES:  Prior radiation? no  Pacemaker/ICD? no  Possible current pregnancy? na  Is the patient on methotrexate? No  Current Complaints / other details: 73 year old male. Married. Per Dr Jose Brewer, pt best suited for external beam radiation due to age and co morbidities.

## 2013-09-03 NOTE — Progress Notes (Signed)
Please see the Nurse Progress Note in the MD Initial Consult Encounter for this patient. 

## 2013-09-09 ENCOUNTER — Telehealth: Payer: Self-pay | Admitting: *Deleted

## 2013-09-09 NOTE — Telephone Encounter (Signed)
CALLED PATIENT TO INFORM OF APPT. WITH DR. PALERMO ON 09-11-13- ARRIVAL TIME - 10:15 AM , AND HIS HORMONE THERAPY AND GOLD SEED PLACEMENT ON 10-08-13 @ DR. HERRICK'S OFFICE, SPOKE WITH PATIENT'S WIFE - BUELAH AND SHE IS AWARE OF THESE APPTS.

## 2015-01-07 DIAGNOSIS — C61 Malignant neoplasm of prostate: Secondary | ICD-10-CM | POA: Diagnosis not present

## 2015-01-07 DIAGNOSIS — C20 Malignant neoplasm of rectum: Secondary | ICD-10-CM | POA: Diagnosis not present

## 2015-02-04 DIAGNOSIS — C19 Malignant neoplasm of rectosigmoid junction: Secondary | ICD-10-CM

## 2015-02-04 DIAGNOSIS — C61 Malignant neoplasm of prostate: Secondary | ICD-10-CM

## 2015-02-21 DIAGNOSIS — C19 Malignant neoplasm of rectosigmoid junction: Secondary | ICD-10-CM | POA: Diagnosis not present

## 2015-02-21 DIAGNOSIS — E860 Dehydration: Secondary | ICD-10-CM | POA: Diagnosis not present

## 2015-02-21 DIAGNOSIS — R197 Diarrhea, unspecified: Secondary | ICD-10-CM | POA: Diagnosis not present

## 2015-02-21 DIAGNOSIS — K1230 Oral mucositis (ulcerative), unspecified: Secondary | ICD-10-CM | POA: Diagnosis not present

## 2015-02-25 DIAGNOSIS — C19 Malignant neoplasm of rectosigmoid junction: Secondary | ICD-10-CM

## 2015-03-14 IMAGING — CT CT CERVICAL SPINE W/O CM
3 of 4 series · 14 of 33 positions shown, 17 images · non-contrast
Comparison: Head CT 03/01/2013.

CLINICAL DATA: History of trauma from a fall.

EXAM:
CT HEAD WITHOUT CONTRAST
CT CERVICAL SPINE WITHOUT CONTRAST
TECHNIQUE: Multidetector CT imaging of the head and cervical spine was
performed following the standard protocol without intravenous
contrast. Multiplanar CT image reconstructions of the cervical spine
were also generated.

[Series 6: coronals · coronal · 0.34mm/px · 3 of 40 slices shown]
[im 8/40  bone]
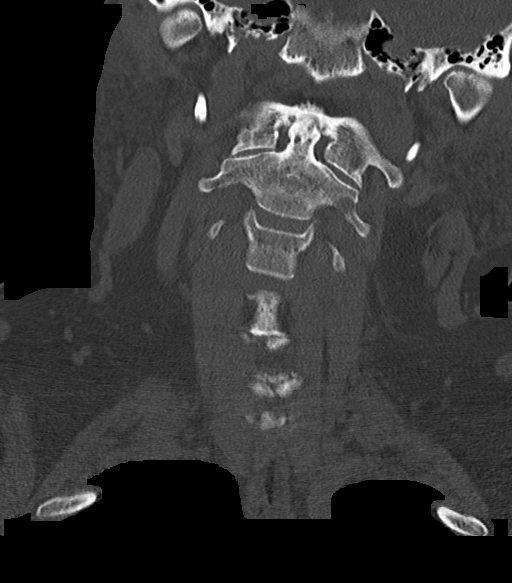
[im 16/40  bone]
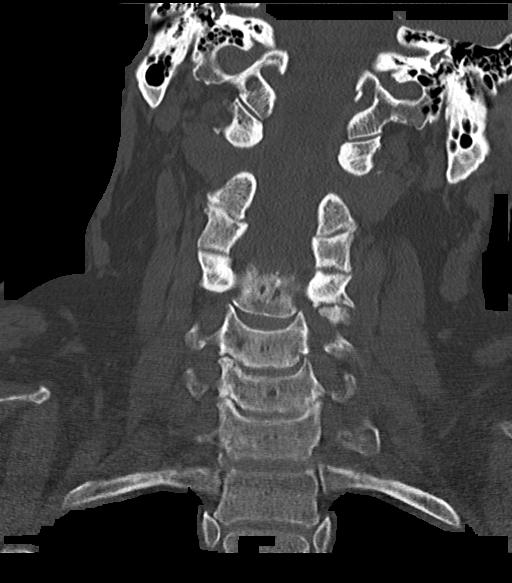
[im 24/40  bone]
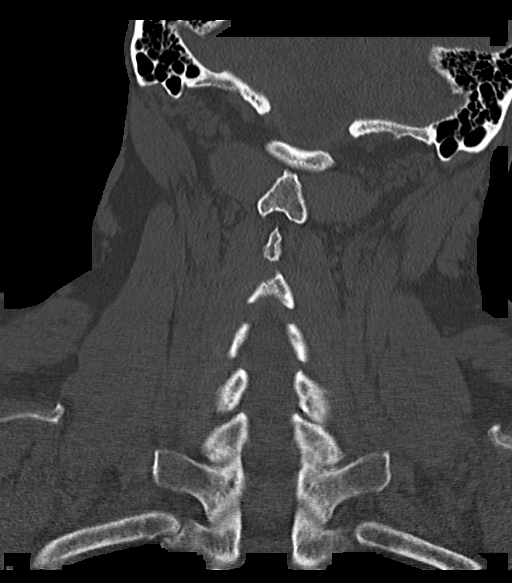

[Series 7: sagittals · sagittal · 0.34mm/px · 5 of 46 slices shown, 6 images]
[im 16/46  bone]
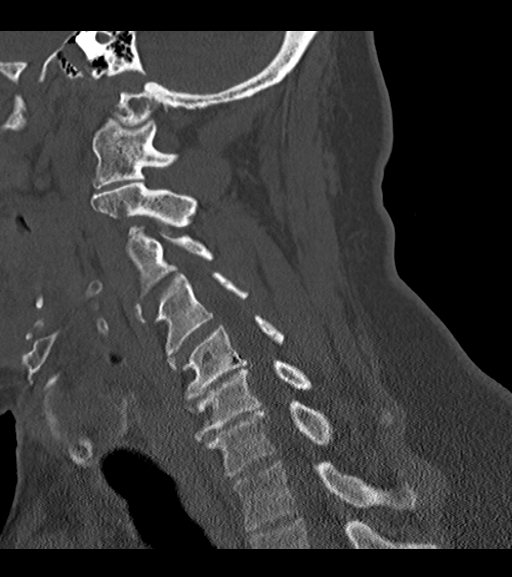
[im 19/46  bone]
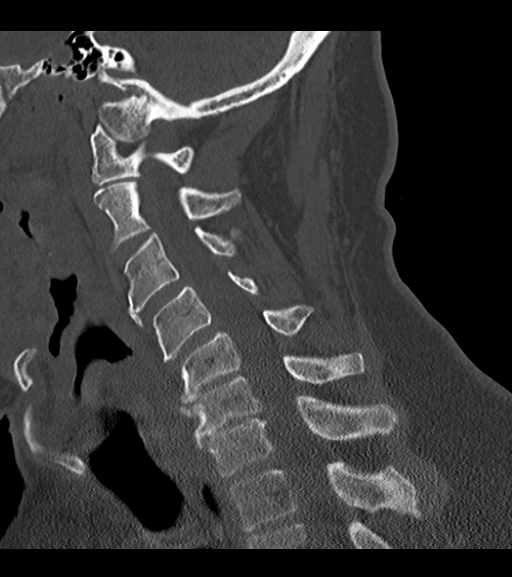
[im 23/46  soft-tissue]
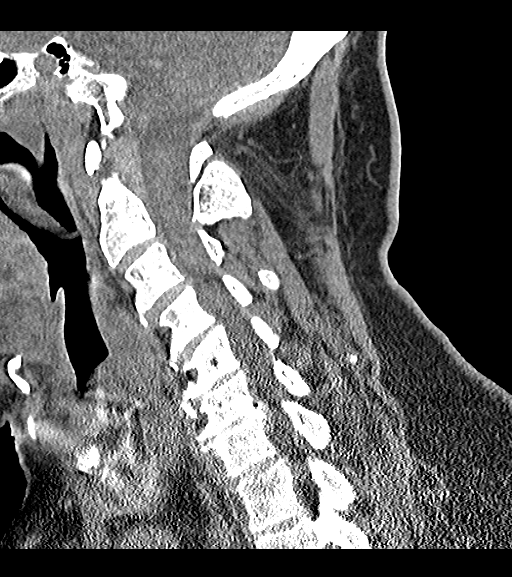
[im 23/46  bone]
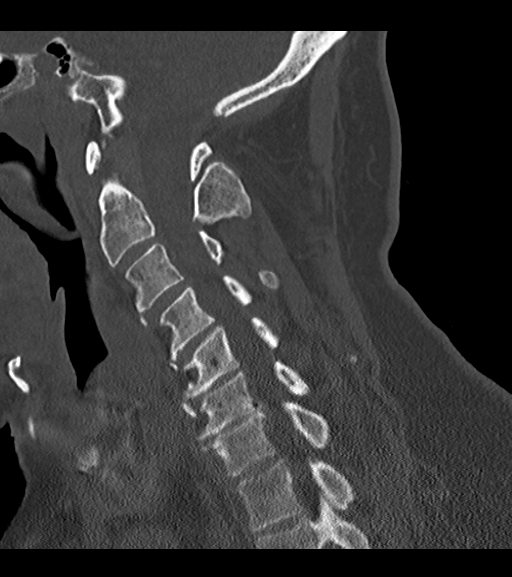
[im 27/46  bone]
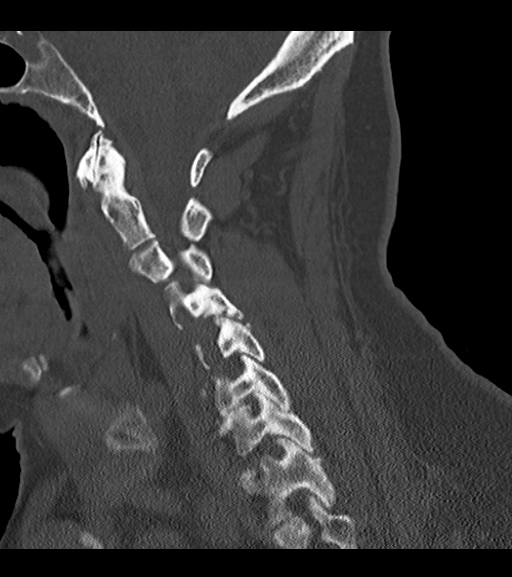
[im 31/46  bone]
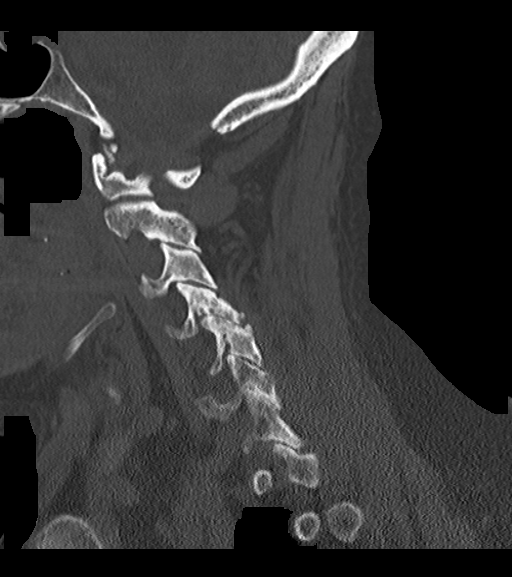

[Series 8: orthogonals · axial · 0.32mm/px · z∈[-298,-162]mm · 6 of 101 slices shown, 8 images]
[im 15/101  soft-tissue]
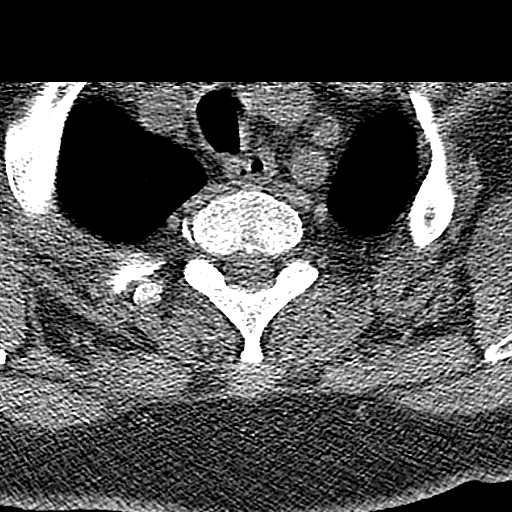
[im 15/101  bone]
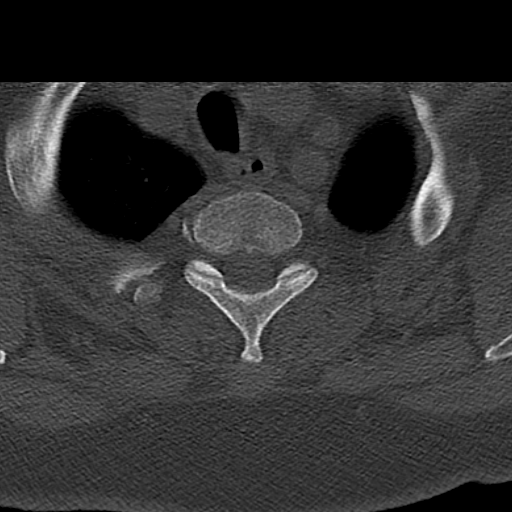
[im 29/101  bone]
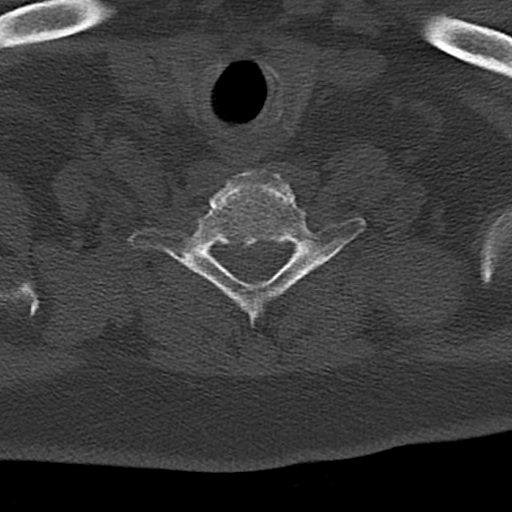
[im 43/101  bone]
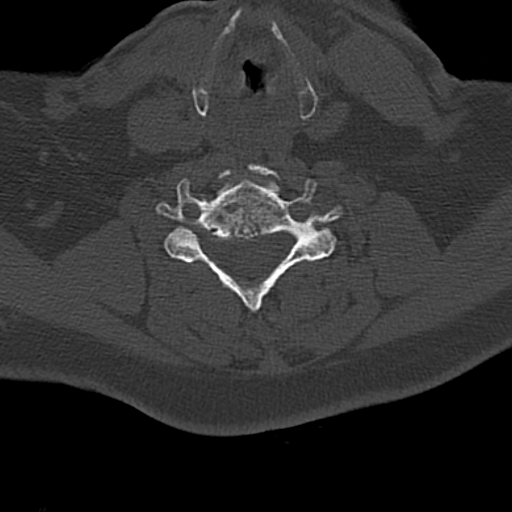
[im 58/101  bone]
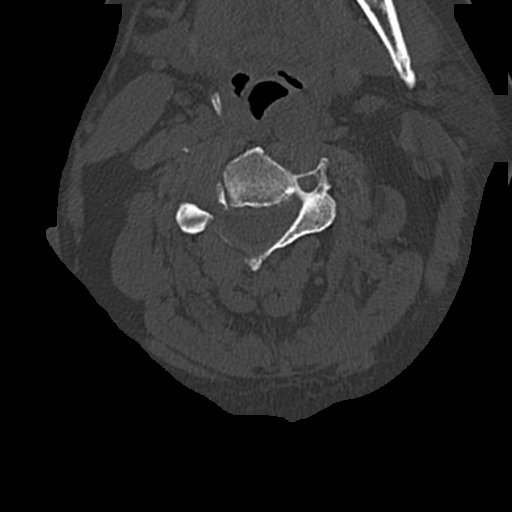
[im 72/101  soft-tissue]
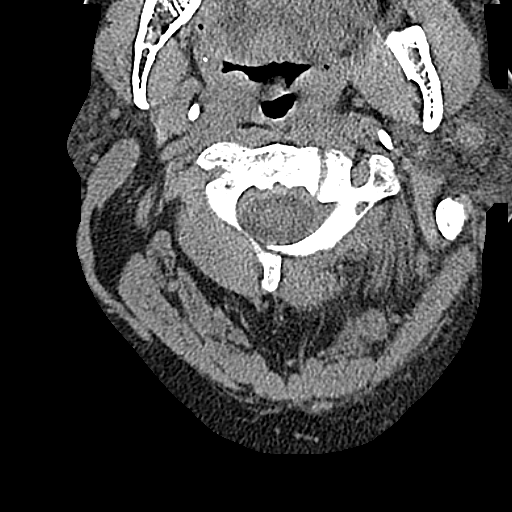
[im 72/101  bone]
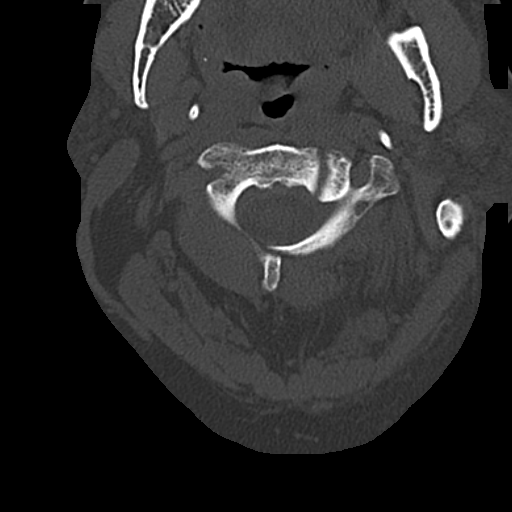
[im 86/101  bone]
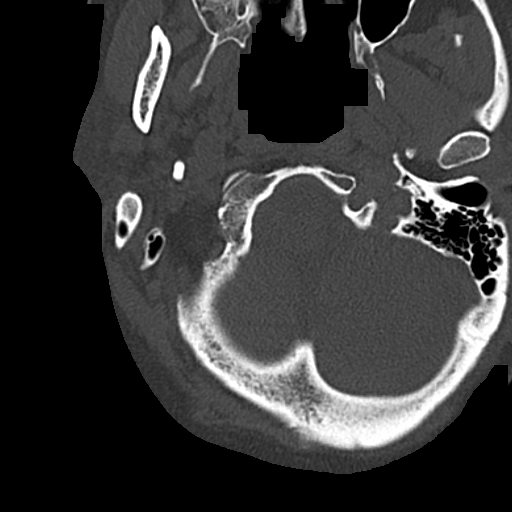

[14 of 33 positions shown; findings below may reference images not displayed]

FINDINGS: CT HEAD FINDINGS

Mild cerebral and moderate cerebellar atrophy. Patchy and confluent
areas of decreased attenuation are noted throughout the deep and
periventricular white matter of the cerebral hemispheres
bilaterally, compatible with chronic microvascular ischemic disease.
No acute displaced skull fractures are identified. No acute
intracranial abnormality. Specifically, no evidence of acute
post-traumatic intracranial hemorrhage, no definite regions of
acute/subacute cerebral ischemia, no focal mass, mass effect,
hydrocephalus or abnormal intra or extra-axial fluid collections.
The visualized paranasal sinuses and mastoids are well pneumatized.

CT CERVICAL SPINE FINDINGS

No acute displaced fracture of the cervical spine. There is some
reversal of normal cervical lordosis centered at the level of C5,
presumably positional. Alignment is otherwise anatomic. Prevertebral
soft tissues are normal. Retropharyngeal left common carotid artery
(normal anatomical variant) incidentally noted. Severe multilevel
degenerative disc disease, most pronounced at C5-C6 and C6-C7.
Severe multilevel facet arthropathy. Visualized portions of the
upper thorax demonstrate some mild expansion of extrapleural fat in
the right apex posteriorly (a benign finding), but are otherwise
unremarkable.
IMPRESSION: 1. No evidence of significant acute traumatic injury to the skull,
brain or cervical spine.
2. Mild cerebral and moderate cerebellar atrophy with extensive
chronic microvascular ischemic changes in the cerebral white matter.
3. Severe multilevel degenerative disc disease and cervical
spondylosis, as above.

## 2015-03-14 IMAGING — CT CT CHEST W/ CM
2 of 4 series · 13 of 36 positions shown, 16 images · IV contrast (APPLIED)
Comparison: Multiple priors, most recently CT of the chest, abdomen
and pelvis 12/25/2011. .

ADDENDUM:
One conclusion was omitted from the original dictation:

1. New low-attenuation lesions in the mid body of the pancreas,
largest of which measures 1.3 x 1.1 cm. These are nonspecific, and
may simply represent tiny pancreatic pseudocysts. However, a
followup MRI of the abdomen with and without IV gadolinium in 1 year
is recommended to ensure the stability or resolution of this
finding.
CLINICAL DATA: History of trauma from a fall.  Chest pain.
EXAM:
CT CHEST, ABDOMEN, AND PELVIS WITH CONTRAST
TECHNIQUE: Multidetector CT imaging of the chest, abdomen and pelvis was
performed following the standard protocol during bolus
administration of intravenous contrast.
CONTRAST:  100mL OMNIPAQUE IOHEXOL 300 MG/ML  SOLN

[Series 3: cap 5.0 i31f 1 · axial · 0.87mm/px · z∈[-817,-292]mm · 10 of 119 slices shown, 13 images]
[im 7/119  mediastinal]
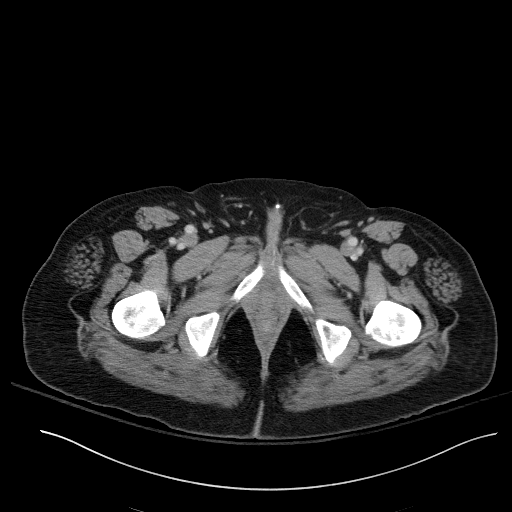
[im 7/119  lung]
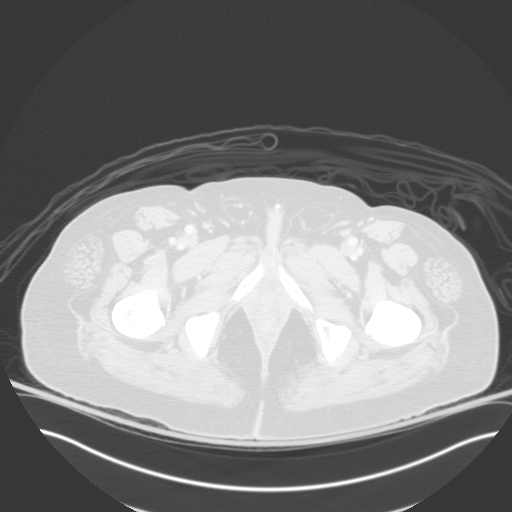
[im 19/119  lung]
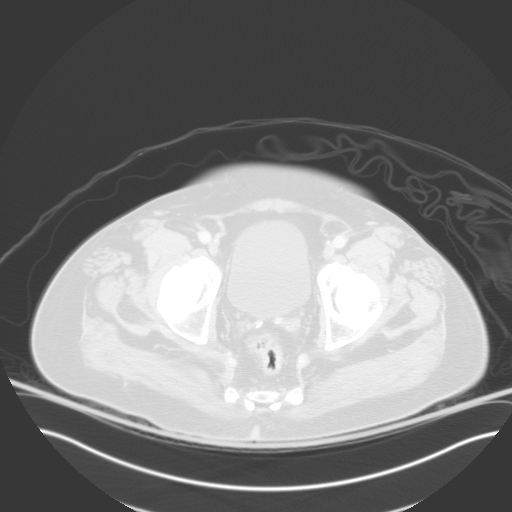
[im 32/119  lung]
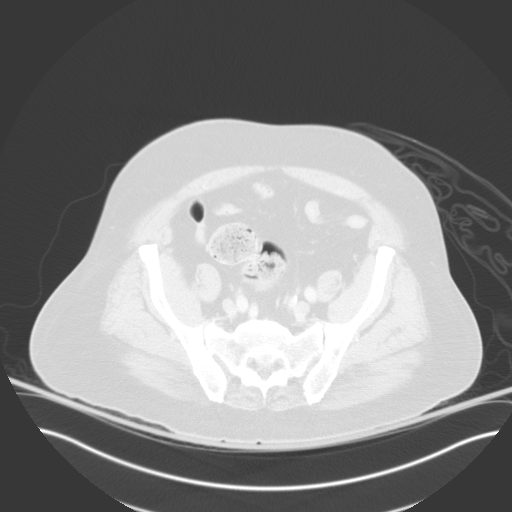
[im 44/119  lung]
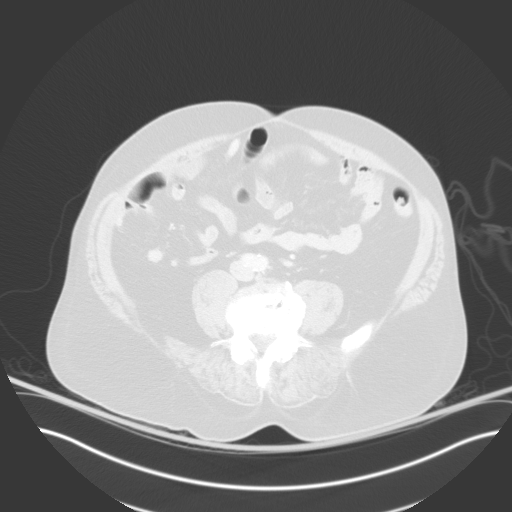
[im 56/119  mediastinal]
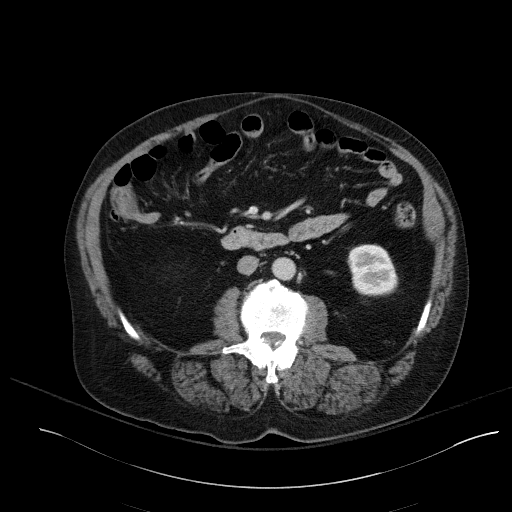
[im 56/119  lung]
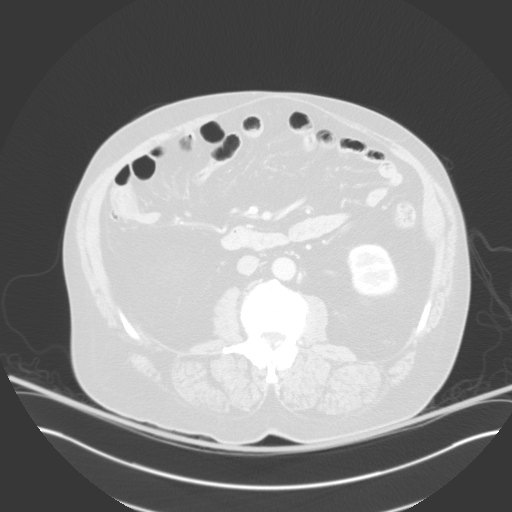
[im 63/119  lung]
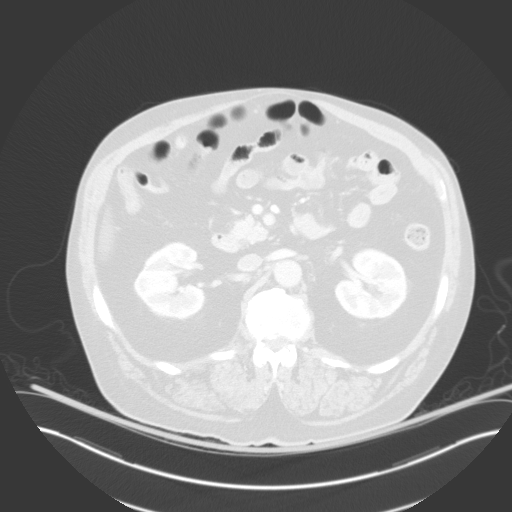
[im 75/119  lung]
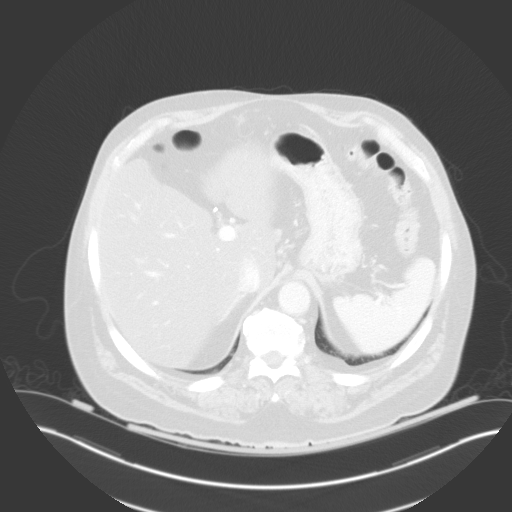
[im 87/119  lung]
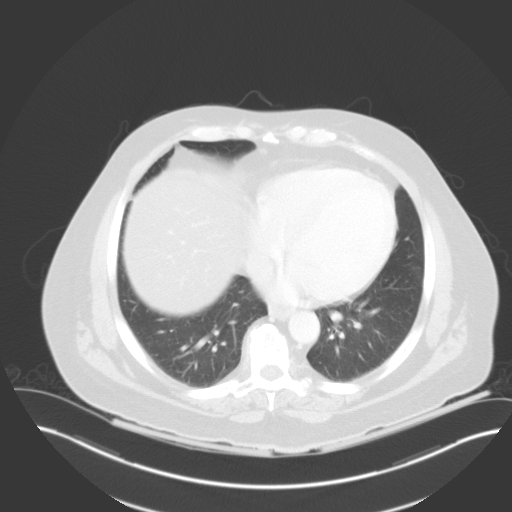
[im 100/119  mediastinal]
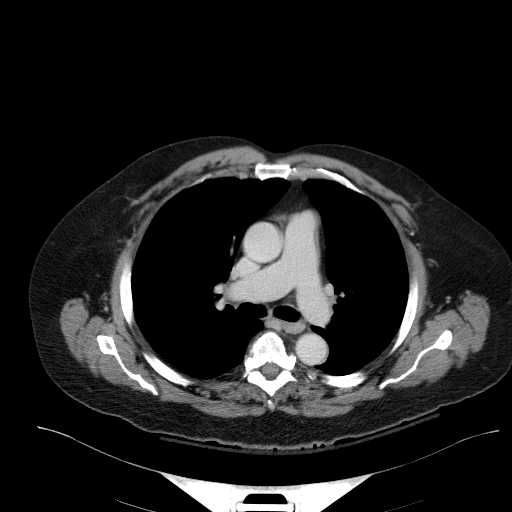
[im 100/119  lung]
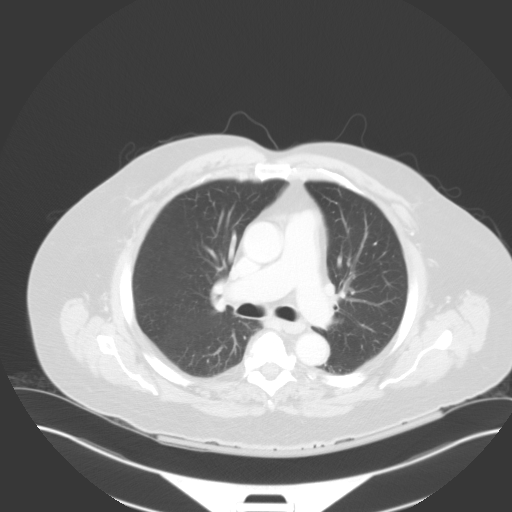
[im 112/119  lung]
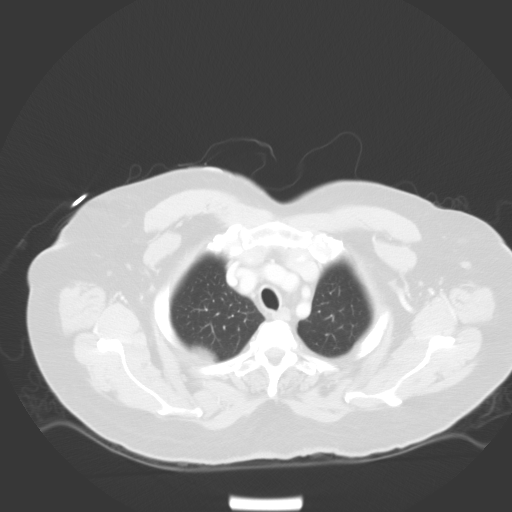

[Series 6: coronal · coronal · 0.85mm/px · 3 of 94 slices shown]
[im 19/94  lung]
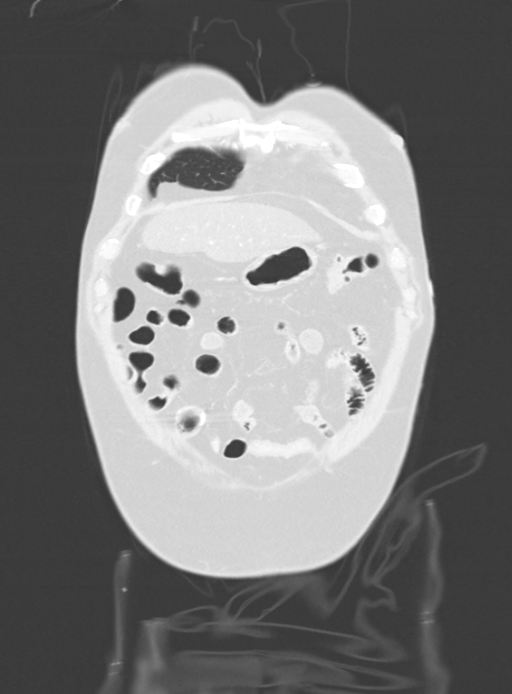
[im 38/94  lung]
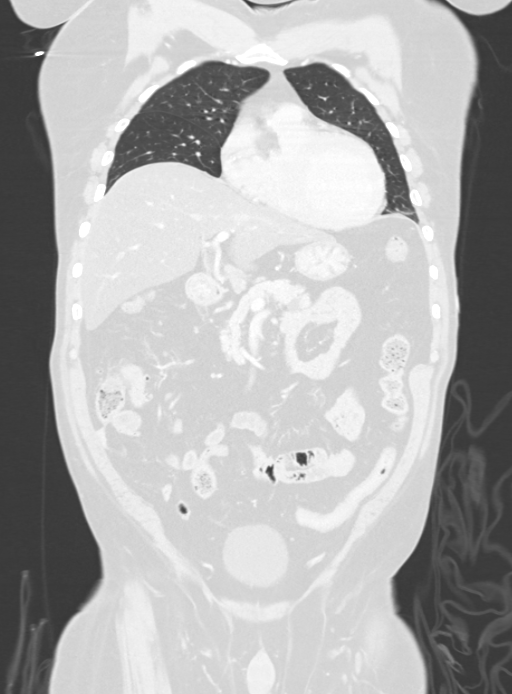
[im 56/94  lung]
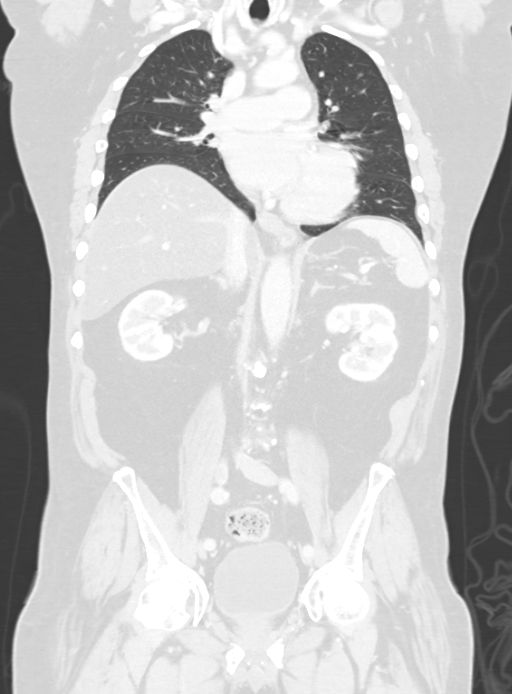

[13 of 36 positions shown; findings below may reference images not displayed]

FINDINGS: CT CHEST FINDINGS

Mediastinum: Heart size is mildly enlarged. There is no significant
pericardial fluid, thickening or pericardial calcification. No
abnormal high attenuation fluid within the mediastinum to suggest
posttraumatic mediastinal hematoma. No evidence of posttraumatic
aortic dissection/transection. No pathologically enlarged
mediastinal or hilar lymph nodes. A small hiatal hernia. There is
atherosclerosis of the thoracic aorta, the great vessels of the
mediastinum and the coronary arteries, including calcified
atherosclerotic plaque in the left circumflex and right coronary
arteries. Calcifications of the aortic valve.

Lungs/Pleura: No acute consolidative airspace disease to suggest
significant posttraumatic contusion or sequela of aspiration. No
pleural effusions. No pneumothorax. No suspicious appearing
pulmonary nodules or masses. Small focus of expansion of
extrapleural fat in the posterior aspect of the right apex (a benign
finding) incidentally noted.

Musculoskeletal: No acute displaced fractures or aggressive
appearing lytic or blastic lesions are noted in the visualized
portions of the skeleton.

CT ABDOMEN AND PELVIS FINDINGS

Abdomen/Pelvis: No abnormal high attenuation fluid collection within
the peritoneal cavity or retroperitoneum to suggest posttraumatic
hemorrhage. No evidence of acute posttraumatic
dissection/transsection of the abdominal aorta or major abdominal
and pelvic arterial branches. Status post cholecystectomy. Diffuse
low attenuation throughout the hepatic parenchyma, compatible with
severe hepatic steatosis. In the mid body of the pancreas there are
2 tiny low-attenuation lesions, largest of which measures 13 x 11 mm
(image 53 of series 3), which are new compared to the prior study.
The appearance of the spleen, bilateral adrenal glands and bilateral
kidneys is unremarkable. No significant volume of ascites. No
pneumoperitoneum. No pathologic distention of small bowel. Mild
atherosclerosis throughout the abdominal and pelvic vasculature. No
definite lymphadenopathy identified in the abdomen or pelvis. Normal
appendix.

Musculoskeletal: No acute displaced fractures or aggressive
appearing lytic or blastic lesions are noted in the visualized
portions of the skeleton.
IMPRESSION: 1. No signs of significant acute traumatic injury to the chest,
abdomen or pelvis.
2. No acute findings.
3. Atherosclerosis, including 2 vessel coronary artery disease.
Assessment for potential risk factor modification, dietary therapy
or pharmacologic therapy may be warranted, if clinically indicated.
4. Severe hepatic steatosis.
5. Small hiatal hernia.
6. Normal appendix.

## 2015-04-09 DEATH — deceased

## 2020-10-05 ENCOUNTER — Other Ambulatory Visit: Payer: Self-pay | Admitting: General Practice

## 2020-10-27 ENCOUNTER — Other Ambulatory Visit: Payer: Self-pay | Admitting: General Practice
